# Patient Record
Sex: Male | Born: 1995 | Race: White | Hispanic: No | Marital: Single | Smoking: Never smoker
Health system: Southern US, Community
[De-identification: ages and names within clinical notes are randomized; demographics above are authoritative.]

## PROBLEM LIST (undated history)

## (undated) DIAGNOSIS — G43909 Migraine, unspecified, not intractable, without status migrainosus: Secondary | ICD-10-CM

## (undated) DIAGNOSIS — R569 Unspecified convulsions: Secondary | ICD-10-CM

## (undated) DIAGNOSIS — Z87820 Personal history of traumatic brain injury: Secondary | ICD-10-CM

## (undated) HISTORY — PX: OTHER SURGICAL HISTORY: SHX169

## (undated) HISTORY — DX: Migraine, unspecified, not intractable, without status migrainosus: G43.909

## (undated) HISTORY — DX: Unspecified convulsions: R56.9

---
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
## 2015-01-01 ENCOUNTER — Ambulatory Visit (INDEPENDENT_AMBULATORY_CARE_PROVIDER_SITE_OTHER): Payer: BLUE CROSS/BLUE SHIELD | Admitting: Diagnostic Neuroimaging

## 2015-01-01 ENCOUNTER — Encounter: Payer: Self-pay | Admitting: Diagnostic Neuroimaging

## 2015-01-01 VITALS — BP 107/60 | HR 66 | Ht 69.0 in | Wt 138.8 lb

## 2015-01-01 DIAGNOSIS — R569 Unspecified convulsions: Secondary | ICD-10-CM | POA: Diagnosis not present
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
## 2015-01-01 NOTE — Patient Instructions (Signed)
Thank you for coming to see us at [facility]. I hope we have been able to provide you high quality care today.  You may receive a patient satisfaction survey over the next few weeks. We would appreciate your feedback and comments so that we may continue to improve ourselves and the health of our patients.  - I will check MRI brain and EEG  - According to [place] law, you can not drive unless you are seizure free for at least 6 months and under physician's care.   - Please maintain seizure precautions. Do not participate in activities where a loss of awareness could harm you or someone else. No swimming alone, no tub bathing, no hot tubs, no driving, no operating motorized vehicles (cars, ATVs, motocycles, etc), lawnmowers or power tools. No standing at heights, such as rooftops, ladders or stairs. Avoid hot objects such as stoves, heaters, open fires. Wear a helmet when riding a bicycle, scooter, skateboard, etc. and avoid areas of traffic. Set your water heater to 120 degrees or less.    ~~~~~~~~~~~~~~~~~~~~~~~~~~~~~~~~~~~~~~~~~~~~~~~~~~~~~~~~~~~~~~~~~  These are some of my general health and wellness recommendations. Some of them may apply to you better than others. Please use common sense as you try these suggestions and feel free to ask me any questions.   RELAXATION Consider practicing mindfulness meditation or other relaxation techniques such as deep breathing, prayer, yoga, tai chi, massage. See website mindful.org or the apps Headspace or Calm to help get started.   SLEEP Try to get at least 7-8+ hours sleep per day. Regular exercise and reduced caffeine will help you sleep better. Practice good sleep hygeine techniques. See website sleep.org for more information. 

## 2015-01-23 ENCOUNTER — Ambulatory Visit
Admission: RE | Admit: 2015-01-23 | Discharge: 2015-01-23 | Disposition: A | Discharge status: Home / Self Care | Payer: BLUE CROSS/BLUE SHIELD | Source: Ambulatory Visit | Attending: Diagnostic Neuroimaging | Admitting: Diagnostic Neuroimaging

## 2015-01-23 DIAGNOSIS — R569 Unspecified convulsions: Secondary | ICD-10-CM

## 2015-01-23 MED ORDER — GADOBENATE DIMEGLUMINE 529 MG/ML IV SOLN
12.0000 mL | Freq: Once | INTRAVENOUS | Status: AC | PRN
  Administered 2015-01-23: 12 mL via INTRAVENOUS

## 2015-01-26 ENCOUNTER — Telehealth: Payer: Self-pay | Admitting: Diagnostic Neuroimaging

## 2015-01-26 ENCOUNTER — Encounter (INDEPENDENT_AMBULATORY_CARE_PROVIDER_SITE_OTHER): Payer: Self-pay

## 2015-01-26 ENCOUNTER — Ambulatory Visit (INDEPENDENT_AMBULATORY_CARE_PROVIDER_SITE_OTHER): Payer: BLUE CROSS/BLUE SHIELD | Admitting: Diagnostic Neuroimaging

## 2015-01-26 ENCOUNTER — Telehealth: Payer: Self-pay | Admitting: *Deleted

## 2015-01-26 DIAGNOSIS — R569 Unspecified convulsions: Secondary | ICD-10-CM | POA: Diagnosis not present

## 2015-01-26 MED ORDER — LEVETIRACETAM 500 MG PO TABS: 500.0000 mg | ORAL_TABLET | Freq: Two times a day (BID) | ORAL | Status: DC

## 2015-01-26 NOTE — Telephone Encounter (Signed)
Left VM informing patient and mother, per Dr Marjory LiesPenumalli, his MRI results are normal. Reminded them of his upcoming EEG apt date/time and FU in Jan. 2017.  Advised to call back if needed. Left this caller's name, number.

## 2015-01-26 NOTE — Telephone Encounter (Signed)
I called patient at home number, gave results to mother. Tried reaching patient at mobile number and gave results. MRI normal. EEG shows generalized spike and wave discharged (3hz ). Will start LEV 500mg  BID. -VRP

## 2015-01-26 NOTE — Procedures (Signed)
   [facility]  EEG (ELECTROENCEPHALOGRAM) REPORT   STUDY DATE: 01/26/15 PATIENT NAME: John Fitzpatrick DOB: 09-Sep-1995 MRN: [id number]  ORDERING CLINICIAN: Joycelyn SchmidVikram Penumalli, MD   TECHNOLOGIST: Gearldine ShownLorraine Jones  TECHNIQUE: Electroencephalogram was recorded utilizing standard 10-20 system of lead placement and reformatted into average and bipolar montages.  RECORDING TIME: 27 minutes ACTIVATION: hyperventilation and photic stimulation  CLINICAL INFORMATION: 19 year old male with new onset seizure on 12/23/14.  FINDINGS: Background rhythms of 10-11 hertz and 90-110 microvolts. There are 2 runs of 3 Hz spike and wave, generalized bifrontal predominance, epileptiform discharges noted. First occurrence lasts for 4 seconds and second occurrence last for 2 seconds. These occur following hyperventilation. Patient is able to respond verbally during these occurrences. No electrographic seizures are seen. Patient recorded in the awake and drowsy state.  IMPRESSION:  Abnormal study demonstrating: 1. Two occurrences of generalized bifrontally predominant spike and wave discharges, 3 Hz, as described above. Otherwise normal background rhythms. 2. Given clinical context, these findings can be seen in association with increased tendency for seizure disorder such as primary generalized epilepsy.    INTERPRETING PHYSICIAN:  Suanne MarkerVIKRAM R. PENUMALLI, MD Certified in Neurology, Neurophysiology and Neuroimaging  [facility] [address], [address] [place], [place] [address] [phone]

## 2015-01-27 NOTE — Telephone Encounter (Addendum)
Pt's mother called back today with questions regarding phone call from Dr Marjory LiesPenumalli yesterday. Her concerns are: side effect from levETIRAcetam (KEPPRA) 500 MG tablet, is it ok for pt not to be seen for 3 mths and has a question regarding EEG and behavioral issues. She can be reached at [phone].

## 2015-01-27 NOTE — Telephone Encounter (Signed)
I called patient's family. He has had some mood, anxiety and anger issues, so thery are concerned about him started LEV. May consider lamotrigine or depakote instead. They will discuss with patient and patient will contact us in AM. -VRP

## 2015-01-28 MED ORDER — LAMOTRIGINE 25 MG PO TABS: ORAL_TABLET | ORAL | Status: DC

## 2015-01-28 NOTE — Addendum Note (Signed)
Addended byJoycelyn Schmid: Holden Maniscalco on: 01/28/2015 08:03 PM   Modules accepted: Orders, Medications

## 2015-01-28 NOTE — Telephone Encounter (Signed)
Pt called sts he is willing to start the lamictal. [facility]

## 2015-01-28 NOTE — Telephone Encounter (Signed)
I called patient x 2. No answer. Sent in rx for lamotrigine. Will call back in AM. -VRP

## 2015-01-29 NOTE — Telephone Encounter (Addendum)
Called mobile #, unable to leave message, mailbox not set up.  Called home phone and spoke with mother. She states John Fitzpatrick Greens called last night, and patient picked up medication last night. Advised they call back as needed. She verbalized understanding, appreciation.

## 2015-03-31 ENCOUNTER — Encounter: Payer: Self-pay | Admitting: Diagnostic Neuroimaging

## 2015-03-31 ENCOUNTER — Ambulatory Visit (INDEPENDENT_AMBULATORY_CARE_PROVIDER_SITE_OTHER): Payer: BLUE CROSS/BLUE SHIELD | Admitting: Diagnostic Neuroimaging

## 2015-03-31 VITALS — BP 123/70 | HR 75 | Ht 69.0 in | Wt 151.0 lb

## 2015-03-31 DIAGNOSIS — G40309 Generalized idiopathic epilepsy and epileptic syndromes, not intractable, without status epilepticus: Secondary | ICD-10-CM

## 2015-03-31 DIAGNOSIS — R569 Unspecified convulsions: Secondary | ICD-10-CM | POA: Diagnosis not present

## 2015-03-31 MED ORDER — LAMOTRIGINE ER 200 MG PO TB24: 200.0000 mg | ORAL_TABLET | Freq: Every day | ORAL | Status: AC
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
## 2015-03-31 NOTE — Patient Instructions (Addendum)
Thank you for coming to see [place] at [facility]. I hope we have been able to provide you high quality care today.  You may receive a patient satisfaction survey over the next few weeks. We would appreciate your feedback and comments so that we may continue to improve ourselves and the health of our patients.  - change lamotrigine to XR 241m daily   ~~~~~~~~~~~~~~~~~~~~~~~~~~~~~~~~~~~~~~~~~~~~~~~~~~~~~~~~~~~~~~~~~  DR. PENUMALLI'S GUIDE TO HAPPY AND HEALTHY LIVING These are some of my general health and wellness recommendations. Some of them may apply to you better than others. Please use common sense as you try these suggestions and feel free to ask me any questions.   ACTIVITY/FITNESS Mental, social, emotional and physical stimulation are very important for brain and body health. Try learning a new activity (arts, music, language, sports, games).  Keep moving your body to the best of your abilities. You can do this at home, inside or outside, the park, community center, gym or anywhere you like. Consider a physical therapist or personal trainer to get started. Consider the app Sworkit. Fitness trackers such as smart-watches, smart-phones or Fitbits can help as well.   NUTRITION Eat more plants: colorful vegetables, nuts, seeds and berries.  Eat less sugar, salt, preservatives and processed foods.  Avoid toxins such as cigarettes and alcohol.  Drink water when you are thirsty. Warm water with a slice of lemon is an excellent morning drink to start the day.  Consider these websites for more information [employer] (hhttps://www.henry-hernandez.biz/ [employer] (wWindowBlog.ch   RELAXATION Consider practicing mindfulness meditation or other relaxation techniques such as deep breathing, prayer, yoga, tai chi, massage. See website mindful.org or the apps Headspace or Calm to help get started.   SLEEP Try  to get at least 7-8+ hours sleep per day. Regular exercise and reduced caffeine will help you sleep better. Practice good sleep hygeine techniques. See website sleep.org for more information.

## 2015-04-01 LAB — COMPREHENSIVE METABOLIC PANEL
A/G RATIO: 1.8 (ref 1.1–2.5)
ALBUMIN: 4.4 g/dL (ref 3.5–5.5)
ALT: 47 IU/L — ABNORMAL HIGH (ref 0–44)
AST: 35 IU/L (ref 0–40)
Alkaline Phosphatase: 91 IU/L (ref 39–117)
BUN / CREAT RATIO: 10 (ref 8–19)
BUN: 12 mg/dL (ref 6–20)
Bilirubin Total: 0.3 mg/dL (ref 0.0–1.2)
CALCIUM: 9.9 mg/dL (ref 8.7–10.2)
CO2: 26 mmol/L (ref 18–29)
CREATININE: 1.21 mg/dL (ref 0.76–1.27)
Chloride: 103 mmol/L (ref 96–106)
GFR calc Af Amer: 100 mL/min/{1.73_m2} (ref >59)
GFR, EST NON AFRICAN AMERICAN: 86 mL/min/{1.73_m2} (ref >59)
GLOBULIN, TOTAL: 2.4 g/dL (ref 1.5–4.5)
Glucose: 88 mg/dL (ref 65–99)
Potassium: 5.2 mmol/L (ref 3.5–5.2)
SODIUM: 145 mmol/L — AB (ref 134–144)
Total Protein: 6.8 g/dL (ref 6.0–8.5)

## 2015-04-01 LAB — CBC WITH DIFFERENTIAL/PLATELET
BASOS: 1 %
Basophils Absolute: 0.1 10*3/uL (ref 0.0–0.2)
EOS (ABSOLUTE): 0.2 10*3/uL (ref 0.0–0.4)
EOS: 5 %
HEMATOCRIT: 42.2 % (ref 37.5–51.0)
HEMOGLOBIN: 14.4 g/dL (ref 12.6–17.7)
IMMATURE GRANULOCYTES: 0 %
Immature Grans (Abs): 0 10*3/uL (ref 0.0–0.1)
LYMPHS ABS: 1.8 10*3/uL (ref 0.7–3.1)
Lymphs: 41 %
MCH: 28.3 pg (ref 26.6–33.0)
MCHC: 34.1 g/dL (ref 31.5–35.7)
MCV: 83 fL (ref 79–97)
MONOCYTES: 8 %
Monocytes Absolute: 0.3 10*3/uL (ref 0.1–0.9)
Neutrophils Absolute: 2 10*3/uL (ref 1.4–7.0)
Neutrophils: 45 %
Platelets: 299 10*3/uL (ref 150–379)
RBC: 5.09 x10E6/uL (ref 4.14–5.80)
RDW: 12.8 % (ref 12.3–15.4)
WBC: 4.3 10*3/uL (ref 3.4–10.8)

## 2015-04-06 ENCOUNTER — Ambulatory Visit: Payer: BLUE CROSS/BLUE SHIELD | Admitting: Diagnostic Neuroimaging

## 2015-04-08 ENCOUNTER — Telehealth: Payer: Self-pay | Admitting: *Deleted

## 2015-04-08 NOTE — Telephone Encounter (Signed)
LVM informing patient per Dr Marjory LiesPenumalli, his labs are okay, will repeat labs in 3-6 months. Left this caller's name, number for questions.

## 2015-09-20 ENCOUNTER — Ambulatory Visit: Payer: BLUE CROSS/BLUE SHIELD | Admitting: Diagnostic Neuroimaging

## 2016-03-01 IMAGING — CT CT HEAD W/O CM
2 series · 16 of 30 positions shown, 20 images · non-contrast
Comparison: None.

CLINICAL DATA: Seizure activity with fall out of chair.

EXAM:
CT HEAD WITHOUT CONTRAST
TECHNIQUE: Contiguous axial images were obtained from the base of the skull
through the vertex without intravenous contrast.

[Series 201: head w/o, idose (1) · axial · non-contrast · 0.41mm/px · z∈[+77,+202]mm · 13 of 31 slices shown, 17 images]
[im 3/31  brain]
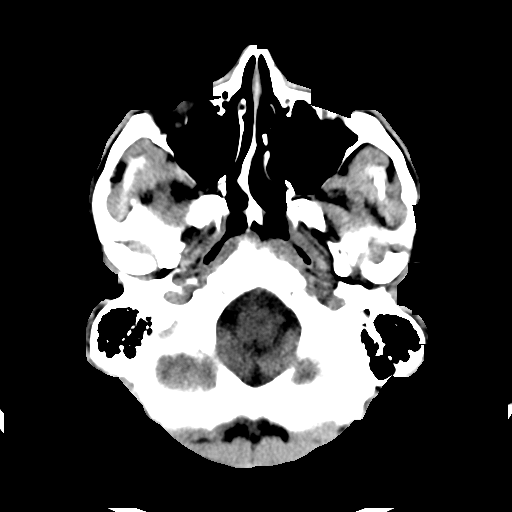
[im 3/31  bone]
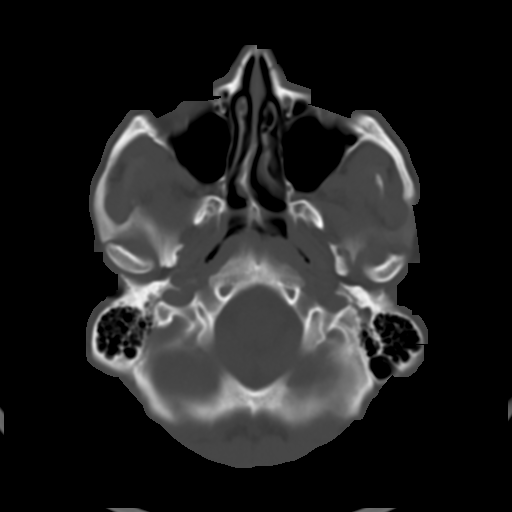
[im 5/31  brain]
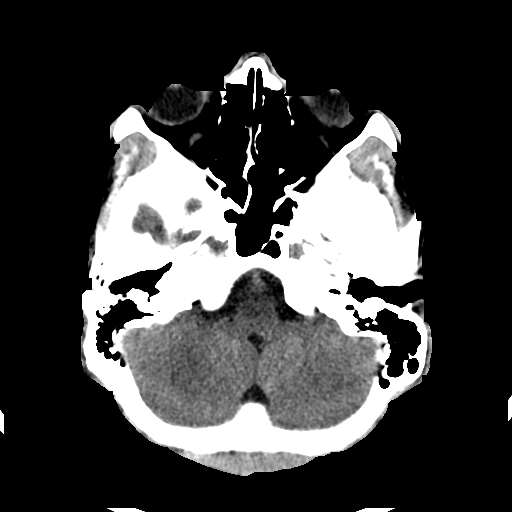
[im 7/31  brain]
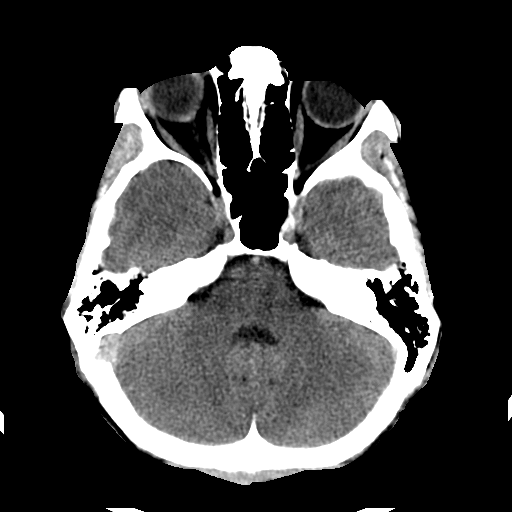
[im 9/31  brain]
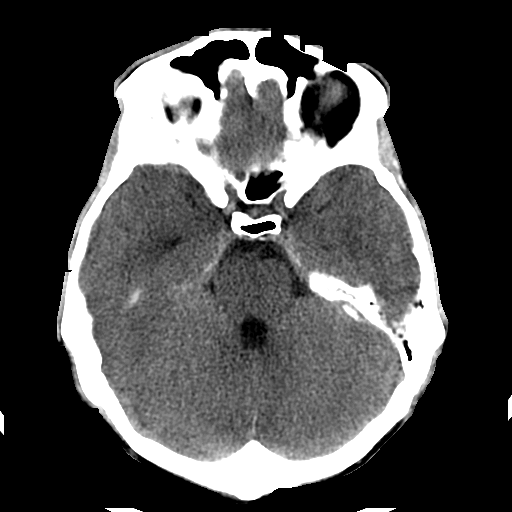
[im 11/31  brain]
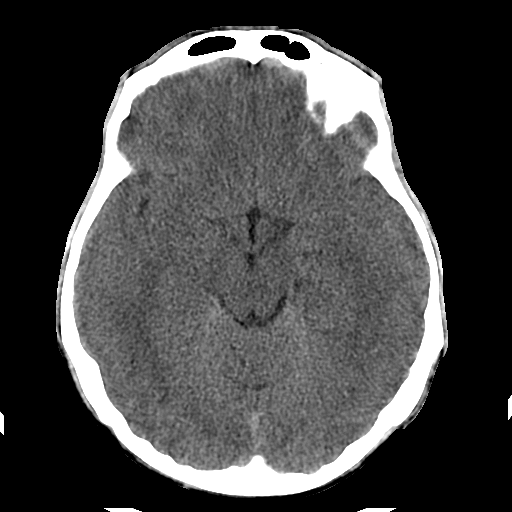
[im 11/31  bone]
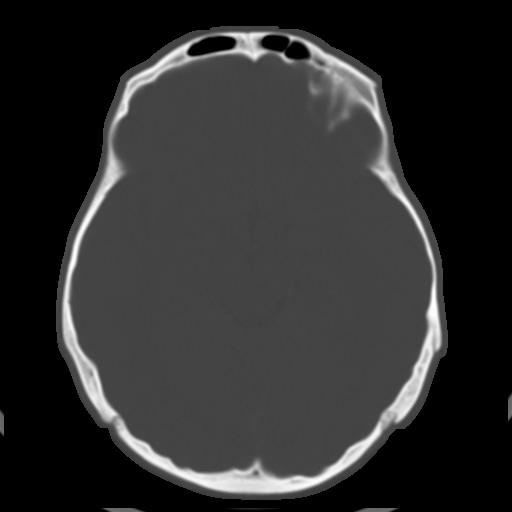
[im 13/31  brain]
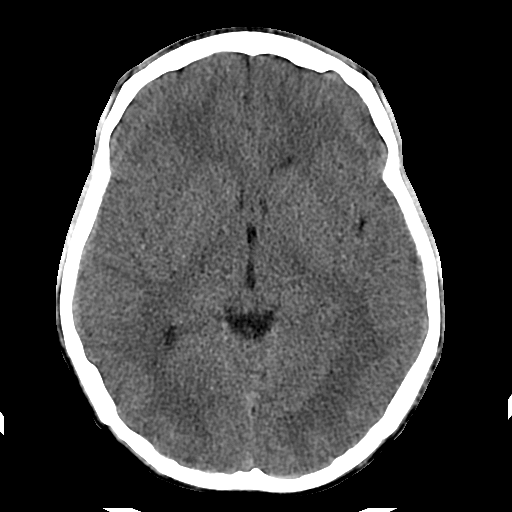
[im 16/31  brain]
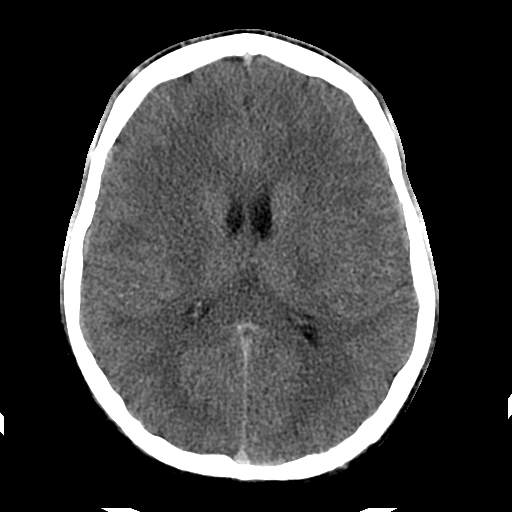
[im 18/31  brain]
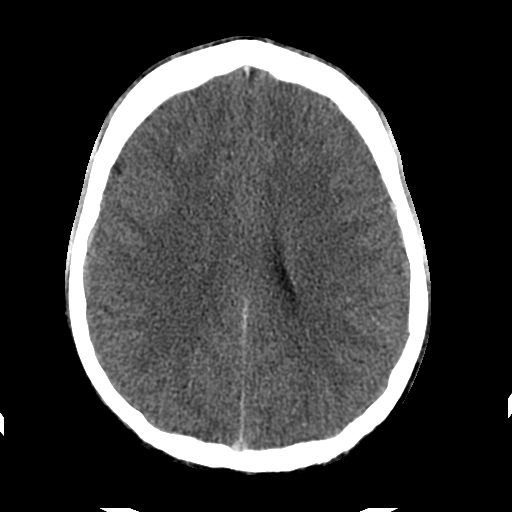
[im 20/31  brain]
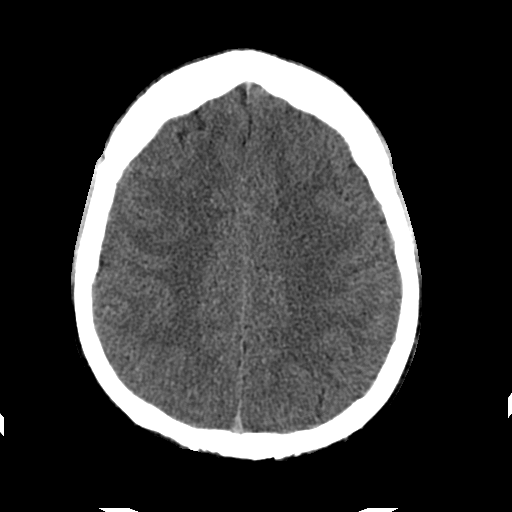
[im 20/31  bone]
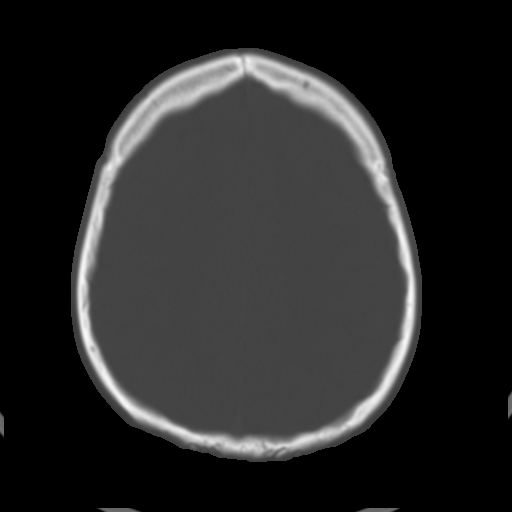
[im 22/31  brain]
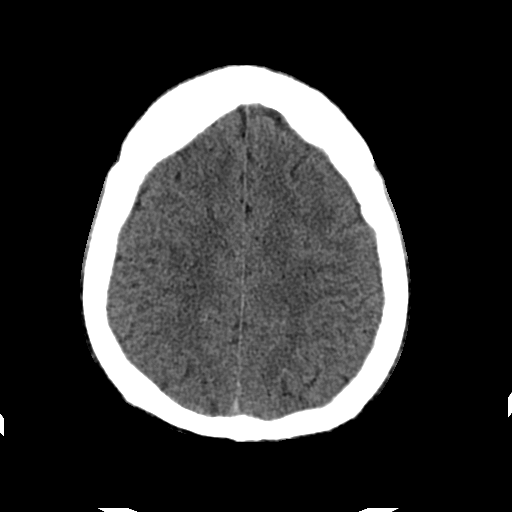
[im 24/31  brain]
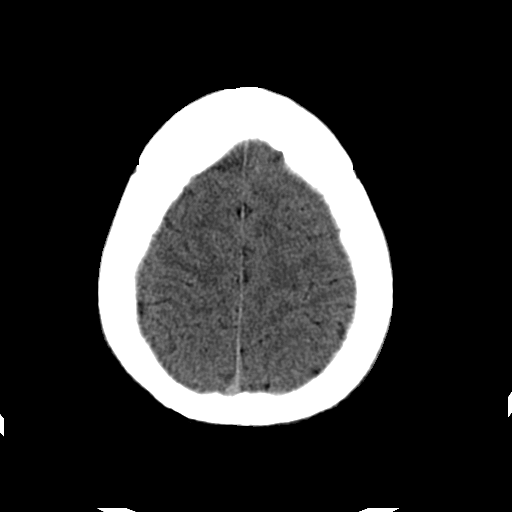
[im 26/31  brain]
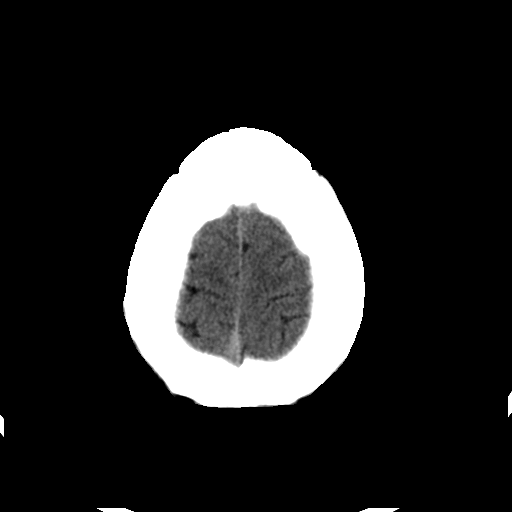
[im 28/31  brain]
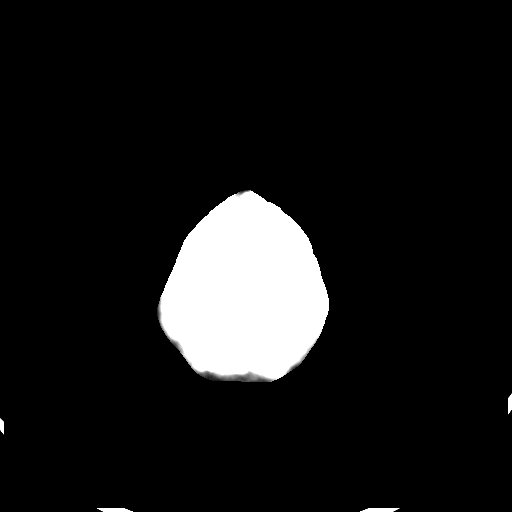
[im 28/31  bone]
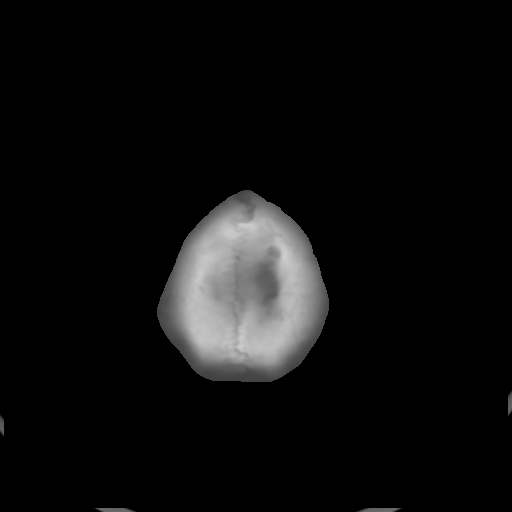

[Series 202: head w/o bone, idose (1) · axial · non-contrast · 0.41mm/px · z∈[+77,+117]mm · 3 of 31 slices shown]
[im 3/31  bone]
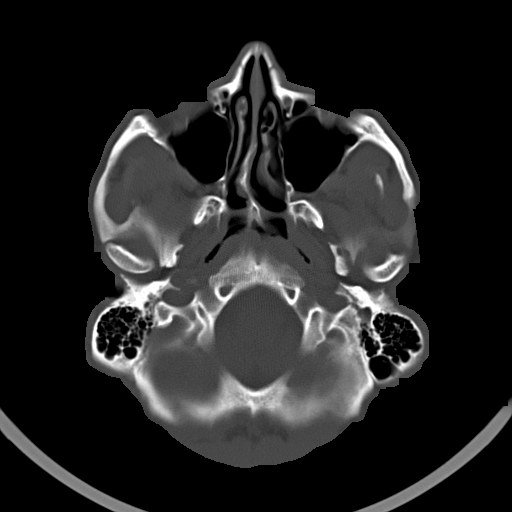
[im 7/31  bone]
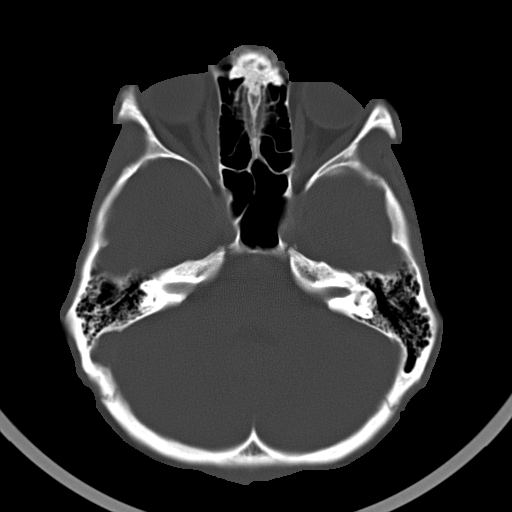
[im 11/31  bone]
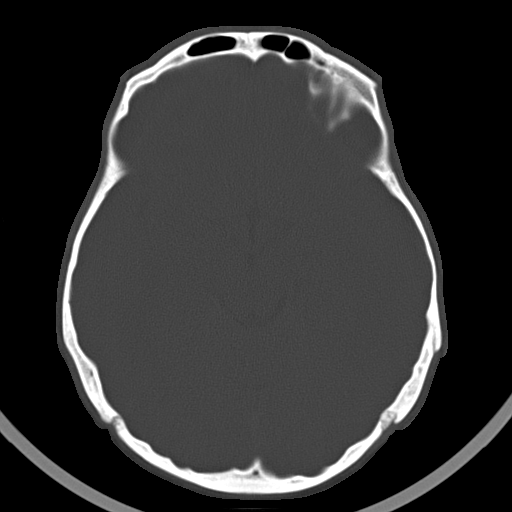

[16 of 30 positions shown; findings below may reference images not displayed]

FINDINGS: Ventricles, cisterns and other CSF spaces are within normal. There
is no mass, mass effect, shift of midline structures or acute
hemorrhage. No evidence of acute infarction. Remaining bones and
soft tissues are within normal.
IMPRESSION: No acute intracranial findings.
